# Patient Record
Sex: Female | Born: 1955 | Race: White | Hispanic: No | Marital: Married | Smoking: Never smoker
Health system: Southern US, Community
[De-identification: ages and names within clinical notes are randomized; demographics above are authoritative.]

## PROBLEM LIST (undated history)

## (undated) DIAGNOSIS — R51 Headache: Secondary | ICD-10-CM

## (undated) DIAGNOSIS — R519 Headache, unspecified: Secondary | ICD-10-CM

## (undated) DIAGNOSIS — I10 Essential (primary) hypertension: Secondary | ICD-10-CM

## (undated) DIAGNOSIS — D860 Sarcoidosis of lung: Secondary | ICD-10-CM

## (undated) DIAGNOSIS — E785 Hyperlipidemia, unspecified: Secondary | ICD-10-CM

## (undated) DIAGNOSIS — R05 Cough: Secondary | ICD-10-CM

## (undated) DIAGNOSIS — G8929 Other chronic pain: Secondary | ICD-10-CM

## (undated) DIAGNOSIS — R059 Cough, unspecified: Secondary | ICD-10-CM

## (undated) HISTORY — DX: Sarcoidosis of lung: D86.0

## (undated) HISTORY — DX: Headache, unspecified: R51.9

## (undated) HISTORY — DX: Hyperlipidemia, unspecified: E78.5

## (undated) HISTORY — DX: Other chronic pain: G89.29

## (undated) HISTORY — DX: Essential (primary) hypertension: I10

## (undated) HISTORY — DX: Headache: R51

## (undated) HISTORY — DX: Cough: R05

## (undated) HISTORY — DX: Cough, unspecified: R05.9

---

## 2008-04-24 ENCOUNTER — Encounter: Payer: Self-pay | Admitting: Pulmonary Disease

## 2008-04-25 ENCOUNTER — Encounter: Payer: Self-pay | Admitting: Pulmonary Disease

## 2008-08-17 ENCOUNTER — Encounter: Payer: Self-pay | Admitting: Pulmonary Disease

## 2008-09-13 ENCOUNTER — Encounter: Payer: Self-pay | Admitting: Pulmonary Disease

## 2008-09-29 ENCOUNTER — Ambulatory Visit: Payer: Self-pay | Admitting: Pulmonary Disease

## 2008-09-29 DIAGNOSIS — R51 Headache: Secondary | ICD-10-CM | POA: Insufficient documentation

## 2008-09-29 DIAGNOSIS — I10 Essential (primary) hypertension: Secondary | ICD-10-CM | POA: Insufficient documentation

## 2008-09-29 DIAGNOSIS — R519 Headache, unspecified: Secondary | ICD-10-CM | POA: Insufficient documentation

## 2008-09-29 DIAGNOSIS — J309 Allergic rhinitis, unspecified: Secondary | ICD-10-CM | POA: Insufficient documentation

## 2008-09-29 DIAGNOSIS — E785 Hyperlipidemia, unspecified: Secondary | ICD-10-CM | POA: Insufficient documentation

## 2008-10-05 ENCOUNTER — Ambulatory Visit: Payer: Self-pay | Admitting: Pulmonary Disease

## 2008-10-05 DIAGNOSIS — D869 Sarcoidosis, unspecified: Secondary | ICD-10-CM | POA: Insufficient documentation

## 2008-11-16 ENCOUNTER — Telehealth (INDEPENDENT_AMBULATORY_CARE_PROVIDER_SITE_OTHER): Payer: Self-pay | Admitting: *Deleted

## 2008-11-27 ENCOUNTER — Encounter: Payer: Self-pay | Admitting: Adult Health

## 2008-11-27 ENCOUNTER — Ambulatory Visit: Payer: Self-pay | Admitting: Internal Medicine

## 2008-12-18 ENCOUNTER — Ambulatory Visit: Payer: Self-pay | Admitting: Pulmonary Disease

## 2008-12-18 DIAGNOSIS — R059 Cough, unspecified: Secondary | ICD-10-CM | POA: Insufficient documentation

## 2008-12-18 DIAGNOSIS — R05 Cough: Secondary | ICD-10-CM | POA: Insufficient documentation

## 2009-01-04 ENCOUNTER — Telehealth: Payer: Self-pay | Admitting: Pulmonary Disease

## 2009-01-24 ENCOUNTER — Ambulatory Visit: Payer: Self-pay | Admitting: Pulmonary Disease

## 2009-07-23 ENCOUNTER — Ambulatory Visit: Payer: Self-pay | Admitting: Pulmonary Disease

## 2010-01-23 ENCOUNTER — Ambulatory Visit: Payer: Self-pay | Admitting: Pulmonary Disease

## 2010-06-06 ENCOUNTER — Ambulatory Visit
Admission: RE | Admit: 2010-06-06 | Discharge: 2010-06-06 | Payer: Self-pay | Source: Home / Self Care | Attending: Pulmonary Disease | Admitting: Pulmonary Disease

## 2010-06-18 NOTE — Miscellaneous (Signed)
Summary: Orders Update pft charges  Clinical Lists Changes  Orders: Added new Service order of Carbon Monoxide diffusing w/capacity (94720) - Signed Added new Service order of Lung Volumes (94240) - Signed Added new Service order of Spirometry (Pre & Post) (94060) - Signed 

## 2010-06-18 NOTE — Assessment & Plan Note (Signed)
Summary: rov for sarcoidosis   Copy to:  Blane Ohara Primary Provider/Referring Provider:  Blane Ohara  CC:  Pt is here for a 6 month f/u appt.  Pt states breathing has improved since last visit.  Pt states she continues to use Foradil and Qvar.  Pt states very rarely non-productive cough.  Pt denied any new complaints.  .  History of Present Illness: the pt comes in today for f/u sarcoidosis, primarily manifesting as cough.  She has been doing well on qvar/foradil combo, and reports no sob, chest congestion, or purulence.  Her cough has been fairly well controlled.  No night sweats or joint issues.  Current Medications (verified): 1)  Wellbutrin Xl 300 Mg Xr24h-Tab (Bupropion Hcl) .Marland Kitchen.. 1 By Mouth Daily 2)  Pristiq 50 Mg Xr24h-Tab (Desvenlafaxine Succinate) .Marland Kitchen.. 1 By Mouth Daily 3)  Simvastatin 80 Mg Tabs (Simvastatin) .Marland Kitchen.. 1 By Mouth At Bedtime 4)  Trilipix 135 Mg Cpdr (Choline Fenofibrate) .Marland Kitchen.. 1 By Mouth Daily 5)  Micardis 20 Mg Tabs (Telmisartan) .Marland Kitchen.. 1 By Mouth Daily 6)  Hydrochlorothiazide 25 Mg Tabs (Hydrochlorothiazide) .Marland Kitchen.. 1 By Mouth Daily 7)  Estrace 2 Mg Tabs (Estradiol) .Marland Kitchen.. 1 By Mouth Daily 8)  Lovaza 1 Gm Caps (Omega-3-Acid Ethyl Esters) .Marland Kitchen.. 1 By Mouth Two Times A Day 9)  Foradil Aerolizer 12 Mcg Caps (Formoterol Fumarate) .... Inhale One Capsule Twice A Day 10)  Qvar 80 Mcg/act  Aers (Beclomethasone Dipropionate) .... Two  Puffs Twice Daily 11)  Klor-Con 20 Meq Pack (Potassium Chloride) .... Take 1 Tablet By Mouth Once A Day  Allergies (verified): 1)  ! Percocet  Review of Systems      See HPI  Vital Signs:  Patient profile:   55 year old female Height:      66 inches Weight:      204.25 pounds BMI:     33.09 O2 Sat:      98 % on Room air Temp:     97.7 degrees F oral Pulse rate:   55 / minute BP sitting:   114 / 78  (left arm) Cuff size:   regular  Vitals Entered By: Arman Filter LPN (July 23, 8293 9:21 AM)  O2 Flow:  Room air CC: Pt is here for a 6  month f/u appt.  Pt states breathing has improved since last visit.  Pt states she continues to use Foradil and Qvar.  Pt states very rarely non-productive cough.  Pt denied any new complaints.   Comments Medications reviewed with patient Arman Filter LPN  July 24, 6211 9:21 AM    Physical Exam  General:  ow female in nad Lungs:  totally clear to auscultation Heart:  rrr Extremities:  no cyanosis or edema   Impression & Recommendations:  Problem # 1:  PULMONARY SARCOIDOSIS (ICD-135) Doing well on current meds.  There is a financial issue with 2 copays on her current regimen, and will therefore try her on dulera.  If this proves too irritating to her upper airway, may try on qvar alone.  Medications Added to Medication List This Visit: 1)  Klor-con 20 Meq Pack (Potassium chloride) .... Take 1 tablet by mouth once a day 2)  Dulera 100-5 Mcg/act Aero (Mometasone furo-formoterol fum) .... 2 puffs twice per day  Other Orders: Est. Patient Level II (08657) Pulmonary Referral (Pulmonary)  Patient Instructions: 1)  will try dulera 100/5 in the place of qvar/foradil to save expense.  Take 2 inhalations in am and pm...rinse  mouth well.  If increased cough, go back to qvar/foradil. 2)  followup with me in 6mos, and will check cxr and pfts at that time.   Prescriptions: DULERA 100-5 MCG/ACT AERO (MOMETASONE FURO-FORMOTEROL FUM) 2 puffs twice per day  #1 x 6   Entered and Authorized by:   Barbaraann Share MD   Signed by:   Barbaraann Share MD on 07/23/2009   Method used:   Print then Give to Patient   RxID:   [id number]    Immunization History:  Influenza Immunization History:    Influenza:  historical (01/17/2009)

## 2010-06-18 NOTE — Assessment & Plan Note (Signed)
Summary: rov for sarcoidosis   Primary Provider/Referring Provider:  Blane Ohara  CC:  followup on pfts and cxr.  no complaints.  Pt states she went back to Foradil and Qvar- states breathing was worse on Dulera- states it "burned her chest."  .  History of Present Illness: the pt comes in today for f/u her known sarcoidosis.  She has had no parenchymal issues, but has had a cough which seems to be controlled with LABA/ICS.  I tried her on dulera in order to limit copays, but she preferred to stay on foradil/qvar.  She goes thru episodes of cough, then may have no symptoms for months and can take a holiday from her inhalers.  She has had no issues with sob, and her pfts today are essentially normal.  Cxr today shows no change.  Current Medications (verified): 1)  Wellbutrin Xl 300 Mg Xr24h-Tab (Bupropion Hcl) .Marland Kitchen.. 1 By Mouth Daily 2)  Pristiq 50 Mg Xr24h-Tab (Desvenlafaxine Succinate) .Marland Kitchen.. 1 By Mouth Daily 3)  Simvastatin 80 Mg Tabs (Simvastatin) .Marland Kitchen.. 1 By Mouth At Bedtime 4)  Trilipix 135 Mg Cpdr (Choline Fenofibrate) .Marland Kitchen.. 1 By Mouth Daily 5)  Micardis 20 Mg Tabs (Telmisartan) .Marland Kitchen.. 1 By Mouth Daily 6)  Hydrochlorothiazide 25 Mg Tabs (Hydrochlorothiazide) .Marland Kitchen.. 1 By Mouth Daily 7)  Estrace 2 Mg Tabs (Estradiol) .Marland Kitchen.. 1 By Mouth Daily 8)  Lovaza 1 Gm Caps (Omega-3-Acid Ethyl Esters) .Marland Kitchen.. 1 By Mouth Two Times A Day 9)  Foradil Aerolizer 12 Mcg Caps (Formoterol Fumarate) .... Inhale One Capsule Twice A Day 10)  Qvar 80 Mcg/act  Aers (Beclomethasone Dipropionate) .... Two  Puffs Twice Daily 11)  Klor-Con 20 Meq Pack (Potassium Chloride) .... Take 1 Tablet By Mouth Once A Day  Allergies (verified): 1)  ! Percocet  Review of Systems       The patient complains of shortness of breath with activity, indigestion, nasal congestion/difficulty breathing through nose, anxiety, and depression.  The patient denies shortness of breath at rest, productive cough, non-productive cough, coughing up blood,  chest pain, irregular heartbeats, acid heartburn, loss of appetite, weight change, abdominal pain, difficulty swallowing, sore throat, tooth/dental problems, headaches, sneezing, itching, ear ache, hand/feet swelling, joint stiffness or pain, rash, change in color of mucus, and fever.    Vital Signs:  Patient profile:   55 year old female Height:      66 inches Weight:      202 pounds BMI:     32.72 O2 Sat:      97 % on Room air Temp:     98.1 degrees F oral Pulse rate:   86 / minute BP sitting:   100 / 78  (right arm) Cuff size:   regular  Vitals Entered By: Kandice Hams CMA (January 23, 2010 10:12 AM)  O2 Flow:  Room air CC: followup on pfts and cxr.  no complaints.  Pt states she went back to Foradil and Qvar- states breathing was worse on Dulera- states it "burned her chest."   Comments Medications reviewed with patient Kandice Hams CMA  January 23, 2010 10:15 AM    Physical Exam  General:  obese female in nad Lungs:  clear to auscultation Heart:  rrr, no mrg Extremities:  no edema or cyanosis  Neurologic:  alert and oriented, moves all 4.   Impression & Recommendations:  Problem # 1:  PULMONARY SARCOIDOSIS (ICD-135) the pt is stable from an xray, pfts, and clinical standpoint.  She will need yearly  f/u, or sooner if having issues  Problem # 2:  COUGH (ICD-786.2) suspect is due to airway involvement with sarcoid.  She responds nicely to LABA/ICS.  Other Orders: Est. Patient Level III (81191) T-2 View CXR (71020TC)  Patient Instructions: 1)  ok to use inhalers just when the cough flares up or becomes an issue for you. 2)  work on weight loss and exercise program 3)  followup with me in one year, and will check a cxr the same day.  If your breathing status changes leading up to your followup visit, let me know and will schedule for same day breathing studies as well.

## 2010-06-26 NOTE — Assessment & Plan Note (Signed)
Summary: acute sick visit for cough   Copy to:  Blane Ohara Primary Provider/Referring Provider:  Blane Ohara  CC:  sick visit.  pt c/o non-productive cough, "no energy, "  wheezing, and and tightness in chest.  Symptoms started 2 weeks ago.  Denies fever.  Marland Kitchen  History of Present Illness: the pt comes in today for an acute sick visit related to worsening cough.  She has known sarcoidosis, but has no airflow obstruction on pfts.  However, she has had a chronic cough that responded very well to ICS/LABA, and therefore the cough was felt due to sarcoid.  She was doing well, and discontinued her meds, but the cough has now returned.  She denies any mucus or purulence, but does describe upper airway wheezing.   Current Medications (verified): 1)  Wellbutrin Xl 300 Mg Xr24h-Tab (Bupropion Hcl) .Marland Kitchen.. 1 By Mouth Daily 2)  Pristiq 50 Mg Xr24h-Tab (Desvenlafaxine Succinate) .Marland Kitchen.. 1 By Mouth Daily 3)  Simvastatin 80 Mg Tabs (Simvastatin) .Marland Kitchen.. 1 By Mouth At Bedtime 4)  Trilipix 135 Mg Cpdr (Choline Fenofibrate) .Marland Kitchen.. 1 By Mouth Daily 5)  Micardis 20 Mg Tabs (Telmisartan) .Marland Kitchen.. 1 By Mouth Daily 6)  Hydrochlorothiazide 25 Mg Tabs (Hydrochlorothiazide) .Marland Kitchen.. 1 By Mouth Daily 7)  Estrace 2 Mg Tabs (Estradiol) .Marland Kitchen.. 1 By Mouth Daily 8)  Lovaza 1 Gm Caps (Omega-3-Acid Ethyl Esters) .Marland Kitchen.. 1 By Mouth Two Times A Day 9)  Klor-Con 20 Meq Pack (Potassium Chloride) .... Take 1 Tablet By Mouth Once A Day 10)  Proair Hfa 108 (90 Base) Mcg/act  Aers (Albuterol Sulfate) .Marland Kitchen.. 1-2 Puffs Every 4-6 Hours As Needed  Allergies (verified): 1)  ! Percocet  Past History:  Past medical, surgical, family and social histories (including risk factors) reviewed, and no changes noted (except as noted below).  Past Medical History:  HEADACHE, CHRONIC (ICD-784.0) HYPERTENSION (ICD-401.9) HYPERLIPIDEMIA (ICD-272.4) ALLERGIC RHINITIS (ICD-477.9) Sarcoidosis OSA    Past Surgical History: Reviewed history from 10/05/2008 and no  changes required. s/p hysterectomy s/p tubal ligation H/o rotator cuff repair  Family History: Reviewed history from 09/29/2008 and no changes required. heart disease: father clotting disorders: mother rheumatism: mother cancer: mother (pancreatic)  Social History: Reviewed history from 09/29/2008 and no changes required. Patient never smoked.  pt is divorced. pt has children. pt works as a Production manager.  Review of Systems       The patient complains of non-productive cough, acid heartburn, nasal congestion/difficulty breathing through nose, anxiety, depression, hand/feet swelling, and joint stiffness or pain.  The patient denies shortness of breath with activity, shortness of breath at rest, productive cough, coughing up blood, chest pain, irregular heartbeats, indigestion, loss of appetite, weight change, abdominal pain, difficulty swallowing, sore throat, tooth/dental problems, headaches, sneezing, itching, ear ache, rash, change in color of mucus, and fever.    Vital Signs:  Patient profile:   55 year old female Height:      66 inches Weight:      203.50 pounds BMI:     32.96 O2 Sat:      96 % on Room air Temp:     97.5 degrees F oral Pulse rate:   71 / minute BP sitting:   124 / 80  (left arm) Cuff size:   regular  Vitals Entered By: Arman Filter LPN (June 06, 2010 9:14 AM)  O2 Flow:  Room air CC: sick visit.  pt c/o non-productive cough, "no energy,"  wheezing, and tightness in chest.  Symptoms started 2 weeks ago.  Denies fever.   Comments Medications reviewed with patient Arman Filter LPN  June 06, 2010 9:14 AM    Physical Exam  General:  ow female in nad Nose:  no purulence or discharge noted. Lungs:  clear to auscultation, no wheezing or crackles. Heart:  rrr, no mrg Extremities:  no edema or cyanosis Neurologic:  alert and oriented, moves all 4.   Impression & Recommendations:  Problem # 1:  COUGH (ICD-786.2) the pt has a h/o cough that has  responded well in the past to ICS/LABA, and felt due to her sarcoid.  The cough has returned since discontinuing her inhalers, and therefore would like to restart to see if she has resolution of her symptoms.  If she does not, may need a course of prednisone.  Must keep in mind other etiologies such as reflux and postnasal drip  Medications Added to Medication List This Visit: 1)  Proair Hfa 108 (90 Base) Mcg/act Aers (Albuterol sulfate) .Marland Kitchen.. 1-2 puffs every 4-6 hours as needed 2)  Symbicort 160-4.5 Mcg/act Aero (Budesonide-formoterol fumarate) .... Two puffs twice daily  Other Orders: Est. Patient Level IV (04540)  Patient Instructions: 1)  get back on your inhalers to see if things improve.  Will try on symbicort 160/4.5 2puffs am and pm to try and consolidate your copays.  Rinse mouth well.   2)  If not improving with symbicort, please call and we can consider a short prednisone taper. 3)  keep established followup with me.    Prescriptions: SYMBICORT 160-4.5 MCG/ACT  AERO (BUDESONIDE-FORMOTEROL FUMARATE) Two puffs twice daily  #1 x 6   Entered and Authorized by:   Barbaraann Share MD   Signed by:   Barbaraann Share MD on 06/06/2010   Method used:   Print then Give to Patient   RxID:   [id number]    Immunization History:  Pneumovax Immunization History:    Pneumovax:  historical (03/19/2010)

## 2011-01-07 ENCOUNTER — Telehealth: Payer: Self-pay | Admitting: Pulmonary Disease

## 2011-01-07 MED ORDER — BUDESONIDE-FORMOTEROL FUMARATE 160-4.5 MCG/ACT IN AERO: 2.0000 | INHALATION_SPRAY | Freq: Two times a day (BID) | RESPIRATORY_TRACT | Status: DC

## 2011-01-07 NOTE — Telephone Encounter (Signed)
Called and spoke with pt. She states needs rx refill for symbicort. I advised okay x 1 but needs appt sched with KC. Pt verbalized understanding and sched appt with KC for 01/28/11 at 10:45 am. She will keep appt for future refills. Rx was sent to [facility] in [place] per her request.

## 2011-01-28 ENCOUNTER — Ambulatory Visit (INDEPENDENT_AMBULATORY_CARE_PROVIDER_SITE_OTHER)
Admission: RE | Admit: 2011-01-28 | Discharge: 2011-01-28 | Disposition: A | Discharge status: Home / Self Care | Payer: 59 | Source: Ambulatory Visit | Attending: Pulmonary Disease | Admitting: Pulmonary Disease

## 2011-01-28 ENCOUNTER — Ambulatory Visit (INDEPENDENT_AMBULATORY_CARE_PROVIDER_SITE_OTHER): Payer: 59 | Admitting: Pulmonary Disease

## 2011-01-28 ENCOUNTER — Encounter: Payer: Self-pay | Admitting: Pulmonary Disease

## 2011-01-28 VITALS — BP 138/80 | HR 64 | Temp 97.6°F | Ht 66.0 in | Wt 207.8 lb

## 2011-01-28 DIAGNOSIS — D869 Sarcoidosis, unspecified: Secondary | ICD-10-CM

## 2011-01-28 NOTE — Assessment & Plan Note (Addendum)
The patient is having chest symptoms that may or may not be related to her sarcoidosis.  She is due for her yearly chest x-ray and pulmonary function studies to evaluate objectively, but I wonder based on her symptoms if reflux disease may be an issue for her.  I would like to try her on a proton pump inhibitor for a few weeks while waiting on her test results.  I would have a high threshold for starting steroids without objective evidence for a sarcoid flare.  I also wonder how much her depression is contributing to her symptoms.

## 2011-01-28 NOTE — Patient Instructions (Signed)
Will check a cxr today and schedule for breathing tests over the next 2 weeks.  Will call you with results Will try you on dexilant one each am for next few weeks to see if chest symptoms improve. Please schedule a followup visit for one year, but we will be in touch about your testing and response to treatment.

## 2011-01-28 NOTE — Progress Notes (Signed)
  Subjective:    Patient ID: Cathy Walton, female    DOB: 31-Jul-1955, 55 y.o.   MRN: [id number]  HPI The patient comes in today for followup of her known pulmonary sarcoidosis.  She has had normal PFTs and chest x-ray last year, and has been treated with symbicort for a chronic cough that responded very well to the inhaler.  She comes in today where she is noting chest heaviness that is persistent and not related to exertion, a dry cough that sounds upper airway in origin, and finally audible wheezing but clearly has upper airway in origin.  The patient is denying any reflux symptoms.  She also has a history of depression, and is teary-eyed today during our visit.  She states that she is "tired of feeling poorly".  She denies having dyspnea on exertion.   Review of Systems  Constitutional: Negative for fever and unexpected weight change.  HENT: Positive for congestion and sinus pressure. Negative for ear pain, nosebleeds, sore throat, rhinorrhea, sneezing, trouble swallowing, dental problem and postnasal drip.   Eyes: Positive for itching. Negative for redness.  Respiratory: Positive for cough, chest tightness and wheezing. Negative for shortness of breath.   Cardiovascular: Negative for palpitations and leg swelling.  Gastrointestinal: Negative for nausea and vomiting.  Genitourinary: Negative for dysuria.  Musculoskeletal: Negative for joint swelling.  Skin: Negative for rash.  Neurological: Positive for headaches.  Hematological: Bruises/bleeds easily.  Psychiatric/Behavioral: Negative for dysphoric mood. The patient is not nervous/anxious.        Objective:   Physical Exam Obese female in no acute distress Nose without purulence or discharge noted Chest totally clear to auscultation, no wheezes or rhonchi Cardiac exam with regular rate and rhythm Lower extremities without edema, no cyanosis noted Alert and oriented, moves all 4 extremities.       Assessment & Plan:

## 2011-01-30 ENCOUNTER — Telehealth: Payer: Self-pay | Admitting: Pulmonary Disease

## 2011-01-30 NOTE — Progress Notes (Signed)
Quick Note:  Spoke with pt and notified of results per Dr. Clance. Pt verbalized understanding and denied any questions.  ______ 

## 2011-01-30 NOTE — Telephone Encounter (Signed)
Spoke with pt and notified of results per Dr.Clance. Pt verbalized understanding and denied any questions.  

## 2011-02-04 ENCOUNTER — Ambulatory Visit (INDEPENDENT_AMBULATORY_CARE_PROVIDER_SITE_OTHER): Payer: 59 | Admitting: Pulmonary Disease

## 2011-02-04 DIAGNOSIS — D869 Sarcoidosis, unspecified: Secondary | ICD-10-CM

## 2011-02-04 LAB — PULMONARY FUNCTION TEST

## 2011-02-04 NOTE — Progress Notes (Signed)
PFT done today. 

## 2011-02-06 ENCOUNTER — Telehealth: Payer: Self-pay | Admitting: Pulmonary Disease

## 2011-02-06 NOTE — Telephone Encounter (Signed)
Megan, please let pt know that her pfts are fairly stable from last year.  I do not see a change in cxr or breathing tests from last year that is significant.  Let's stick with plan from last office visit, and give her time on the medication for acid reflux.  Have let me know how things are going after another week of therapy.

## 2011-02-07 NOTE — Telephone Encounter (Signed)
[id number] 

## 2011-02-12 NOTE — Telephone Encounter (Signed)
[id number] 

## 2011-02-13 ENCOUNTER — Encounter: Payer: Self-pay | Admitting: *Deleted

## 2011-02-13 NOTE — Telephone Encounter (Signed)
[id number].  This is my 3rd attempt to contact pt.  Per protocol, will sign phone note and send pt a letter to call our office to discuss results.

## 2012-01-28 ENCOUNTER — Ambulatory Visit (INDEPENDENT_AMBULATORY_CARE_PROVIDER_SITE_OTHER): Payer: 59 | Admitting: Pulmonary Disease

## 2012-01-28 ENCOUNTER — Encounter: Payer: Self-pay | Admitting: Pulmonary Disease

## 2012-01-28 ENCOUNTER — Ambulatory Visit (INDEPENDENT_AMBULATORY_CARE_PROVIDER_SITE_OTHER)
Admission: RE | Admit: 2012-01-28 | Discharge: 2012-01-28 | Disposition: A | Discharge status: Home / Self Care | Payer: 59 | Source: Ambulatory Visit | Attending: Pulmonary Disease | Admitting: Pulmonary Disease

## 2012-01-28 VITALS — BP 112/86 | HR 72 | Temp 97.9°F | Ht 66.0 in | Wt 186.0 lb

## 2012-01-28 DIAGNOSIS — Z23 Encounter for immunization: Secondary | ICD-10-CM

## 2012-01-28 DIAGNOSIS — D869 Sarcoidosis, unspecified: Secondary | ICD-10-CM

## 2012-01-28 NOTE — Progress Notes (Signed)
  Subjective:    Patient ID: Cathy Walton, female    DOB: 05/31/1955, 56 y.o.   MRN: [id number]  HPI The patient comes in today for followup of her known sarcoidosis, primarily manifested as cough.  She had been doing very well on symbicort, however recently had worsening of her cough after discontinuing.  She has subsequently gotten back on the inhaler, with resolution of her symptoms. She denies any worsening shortness of breath, joint complaints, or significant skin changes.   Review of Systems  Constitutional: Negative for fever and unexpected weight change.  HENT: Positive for congestion and rhinorrhea. Negative for ear pain, nosebleeds, sore throat, sneezing, trouble swallowing, dental problem, postnasal drip and sinus pressure.   Eyes: Negative for redness and itching.  Respiratory: Negative for cough, chest tightness, shortness of breath and wheezing.   Cardiovascular: Negative for palpitations and leg swelling.  Gastrointestinal: Negative for nausea and vomiting.  Genitourinary: Negative for dysuria.  Musculoskeletal: Negative for joint swelling.  Skin: Negative for rash.  Neurological: Positive for headaches.  Hematological: Bruises/bleeds easily.  Psychiatric/Behavioral: Positive for dysphoric mood. The patient is nervous/anxious.        Objective:   Physical Exam Obese female in nad Nose without purulence or discharge noted. Chest totally clear to auscultation Cor with rrr LE without edema, no cyanosis  Alert and oriented, moves all 4.        Assessment & Plan:

## 2012-01-28 NOTE — Assessment & Plan Note (Signed)
The pt is doing well from a sarcoid standpoint, but did have escalation of her cough off symbicort.  This has been restarted, and now is doing well. Will not check PFT's this visit since she is doing so well, but will check one more cxr.  I have also asked her to work on weight loss and conditioning.

## 2012-01-28 NOTE — Patient Instructions (Addendum)
Ok to decrease symbicort to once a day to see if it will keep cough controlled. Will check cxr today, and call you with results. followup with me in one year.

## 2012-02-13 NOTE — Progress Notes (Signed)
Quick Note:  Called, spoke with pt. Informed her of cxr results per Dr. Shelle Iron. She verbalized understanding. ______

## 2013-01-27 ENCOUNTER — Ambulatory Visit (INDEPENDENT_AMBULATORY_CARE_PROVIDER_SITE_OTHER): Payer: 59 | Admitting: Pulmonary Disease

## 2013-01-27 ENCOUNTER — Encounter: Payer: Self-pay | Admitting: Pulmonary Disease

## 2013-01-27 VITALS — BP 118/72 | HR 56 | Temp 97.8°F | Ht 66.0 in | Wt 201.6 lb

## 2013-01-27 DIAGNOSIS — D869 Sarcoidosis, unspecified: Secondary | ICD-10-CM

## 2013-01-27 NOTE — Progress Notes (Signed)
  Subjective:    Patient ID: Cathy Walton, female    DOB: 1956-01-18, 57 y.o.   MRN: [id number]  HPI The patient comes in today for followup of her known sarcoidosis.  She has not had any significant pulmonary manifestation except occasional cough, and has been using symbicort for a few weeks at a time if her cough flares up rather than on an everyday basis.  She has been doing well with this, and feels that her cough is well controlled.  She has no breathing issues, nor has she seen any skin changes.  She is seeing an ophthalmologist yearly for her eyes.   Review of Systems  Constitutional: Negative for fever and unexpected weight change.  HENT: Positive for congestion and sinus pressure. Negative for ear pain, nosebleeds, sore throat, rhinorrhea, sneezing, trouble swallowing, dental problem and postnasal drip.   Eyes: Negative for redness and itching.  Respiratory: Negative for cough, chest tightness, shortness of breath and wheezing.   Cardiovascular: Negative for palpitations and leg swelling.  Gastrointestinal: Negative for nausea and vomiting.  Genitourinary: Negative for dysuria.  Musculoskeletal: Negative for joint swelling.  Skin: Negative for rash.  Neurological: Positive for headaches.  Hematological: Does not bruise/bleed easily.  Psychiatric/Behavioral: Negative for dysphoric mood. The patient is not nervous/anxious.        Objective:   Physical Exam Overweight female in no acute distress Nose without purulence or discharge noted Neck without lymphadenopathy or thyromegaly Chest totally clear to auscultation, no wheezing Cardiac exam with regular rate and rhythm Lower extremities without edema, no cyanosis Alert and oriented, moves all 4 extremities.       Assessment & Plan:

## 2013-01-27 NOTE — Assessment & Plan Note (Signed)
The patient is doing very well from a sarcoid standpoint.  Her PFTs in the past have been completely stable, and her chest x-ray last year showed no acute process.  Her cough is well controlled with the symbicort, and in fact she is only using this when she feels her cough beginning to flareup.  Because of this, I would like to change her inhaler over to Qvar alone to see if this will do just as well.  I've also encouraged her to work aggressively on weight loss and some type of exercise program.  Like to see her back in one year.

## 2013-01-27 NOTE — Patient Instructions (Addendum)
Would like to see if we can get you by on less medication.  Would try qvar 80, 2 inhalations am and pm.  Rinse mouth well.  Let me know if this does not work for you.  (use up your supply of symbicort first). followup with me in one year, but call if having issues.  Will check followup chest xray next visit.

## 2014-01-27 ENCOUNTER — Encounter: Payer: Self-pay | Admitting: Pulmonary Disease

## 2014-01-27 ENCOUNTER — Encounter (INDEPENDENT_AMBULATORY_CARE_PROVIDER_SITE_OTHER): Payer: Self-pay

## 2014-01-27 ENCOUNTER — Ambulatory Visit (INDEPENDENT_AMBULATORY_CARE_PROVIDER_SITE_OTHER): Payer: 59 | Admitting: Pulmonary Disease

## 2014-01-27 ENCOUNTER — Ambulatory Visit (INDEPENDENT_AMBULATORY_CARE_PROVIDER_SITE_OTHER)
Admission: RE | Admit: 2014-01-27 | Discharge: 2014-01-27 | Disposition: A | Discharge status: Home / Self Care | Payer: 59 | Source: Ambulatory Visit | Attending: Pulmonary Disease | Admitting: Pulmonary Disease

## 2014-01-27 VITALS — BP 122/74 | HR 56 | Temp 97.7°F | Ht 66.0 in | Wt 205.8 lb

## 2014-01-27 DIAGNOSIS — D869 Sarcoidosis, unspecified: Secondary | ICD-10-CM

## 2014-01-27 NOTE — Patient Instructions (Signed)
Continue to stay active, and you have qvar available if your cough returns. followup with me one more time in a year, and will check final chest xray.

## 2014-01-27 NOTE — Progress Notes (Signed)
   Subjective:    Patient ID: Cathy Walton, female    DOB: [birth year], 58 y.o.   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 It has primarily manifested as a cough, and at the last visit I have given her Qvar to use instead of Symbicort if cough persisted. She comes in today where she tells me she did not require the Qvar, and her cough has totally resolved. She feels her breathing is at baseline, and has no joint or eye complaints. She has had a followup chest x-ray today which is unremarkable.   Review of Systems  Constitutional: Negative for fever and unexpected weight change.  HENT: Negative for congestion, dental problem, ear pain, nosebleeds, postnasal drip, rhinorrhea, sinus pressure, sneezing, sore throat and trouble swallowing.   Eyes: Negative for redness and itching.  Respiratory: Negative for cough, chest tightness, shortness of breath and wheezing.   Cardiovascular: Negative for palpitations and leg swelling.  Gastrointestinal: Negative for nausea and vomiting.  Genitourinary: Negative for dysuria.  Musculoskeletal: Negative for joint swelling.  Skin: Negative for rash.  Neurological: Negative for headaches.  Hematological: Does not bruise/bleed easily.  Psychiatric/Behavioral: Negative for dysphoric mood. The patient is not nervous/anxious.        Objective:   Physical Exam Ow female in nad Nose without purulence or d/c noted Neck without LN or TMG Chest totally clear Cor with rrr LE without edema, no cyanosis Alert and oriented, moves all 4       Assessment & Plan:

## 2014-01-27 NOTE — Assessment & Plan Note (Signed)
The patient is doing very well currently, and is basically asymptomatic. She has not required Qvar for cough, and feels that her breathing is doing well. Her chest x-ray today appears clear. I have asked her to followup one more time with me in a year, and we'll check a final chest x-ray.

## 2015-01-29 ENCOUNTER — Encounter: Payer: Self-pay | Admitting: Emergency Medicine

## 2015-01-29 ENCOUNTER — Ambulatory Visit (INDEPENDENT_AMBULATORY_CARE_PROVIDER_SITE_OTHER): Payer: 59 | Admitting: Emergency Medicine

## 2015-01-29 ENCOUNTER — Ambulatory Visit: Payer: 59 | Admitting: Pulmonary Disease

## 2015-01-29 ENCOUNTER — Ambulatory Visit (INDEPENDENT_AMBULATORY_CARE_PROVIDER_SITE_OTHER)
Admission: RE | Admit: 2015-01-29 | Discharge: 2015-01-29 | Disposition: A | Discharge status: Home / Self Care | Payer: 59 | Source: Ambulatory Visit | Attending: Emergency Medicine | Admitting: Emergency Medicine

## 2015-01-29 VITALS — BP 118/82 | HR 57 | Ht 66.0 in | Wt 208.0 lb

## 2015-01-29 DIAGNOSIS — D869 Sarcoidosis, unspecified: Secondary | ICD-10-CM

## 2015-01-29 NOTE — Patient Instructions (Signed)
We will perform full pulmonary function testing to compare with your prior Chest x-ray today to compare with prior Remember to get the flu shot this fall Follow with your ophthalmologist annually as planned Follow with Dr Delton Coombes in 12 months or sooner if you have any problems

## 2015-01-29 NOTE — Assessment & Plan Note (Signed)
Follow-up visit for pulmonary sarcoidosis that appears to have fully responded to corticosteroids and is now quiescent. She is not having difficulty with cough or dyspnea. Currently off bronchodilators. She has not required systemic steroids for at least 4 years. At this point I believe we need to follow her pulmonary function testing and chest x-ray. She does not need bronchodilators for now. I underscored the importance of her annual ophthalmology exam. We will follow in 1 year or prn.

## 2015-01-29 NOTE — Progress Notes (Signed)
Subjective:    Patient ID: Cathy Walton, female    DOB: 07-25-1955, 59 y.o.   MRN: [id number]  HPI 59 year old never smoker with a history of sarcoidosis, hypertension, allergic rhinitis and chronic cough. Her most recent pulmonary function testing was in 2012 and was reviewed by me today. It showed mild obstruction without a bronchodilator response, normal lung volumes and a slightly decreased diffusion capacity that corrects to normal range for alveolar volume. 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  Has been treated for ocular and pulmonary sarcoid with prednisone in the past.  She was able to stop symbicort 2 yrs, hasn't needed albuterol. No functional limitations.    Review of Systems Per HPI  Past Medical History  Diagnosis Date  . Cough   . Pulmonary sarcoidosis   . Chronic headache   . Hypertension   . Hyperlipidemia   . Allergic rhinitis      No family history on file.   Social History   Social History  . Marital Status: Married    Spouse Name: N/A  . Number of Children: N/A  . Years of Education: N/A   Occupational History  . Not on file.   Social History Main Topics  . Smoking status: Never Smoker   . Smokeless tobacco: Not on file  . Alcohol Use: Not on file  . Drug Use: Not on file  . Sexual Activity: Not on file   Other Topics Concern  . Not on file   Social History Narrative     Allergies  Allergen Reactions  . Oxycodone-Acetaminophen     REACTION: itch     Outpatient Prescriptions Prior to Visit  Medication Sig Dispense Refill  . albuterol (PROVENTIL HFA;VENTOLIN HFA) 108 (90 BASE) MCG/ACT inhaler Inhale 2 puffs into the lungs every 6 (six) hours as needed.      Marland Kitchen buPROPion (WELLBUTRIN SR) 150 MG 12 hr tablet Take 1 tablet by mouth Twice daily.    . CRESTOR 20 MG tablet Take 40 mg by mouth daily.     Marland Kitchen estradiol (ESTRACE) 2 MG  tablet Take 1 tablet by mouth daily.    . hydrochlorothiazide 25 MG tablet Take 1 tablet by mouth daily.    Marland Kitchen KLOR-CON M20 20 MEQ tablet Take 1 tablet by mouth daily.    Marland Kitchen PRISTIQ 100 MG 24 hr tablet Take 1 tablet by mouth daily.    . valsartan (DIOVAN) 40 MG tablet Take 40 mg by mouth daily.    . budesonide-formoterol (SYMBICORT) 160-4.5 MCG/ACT inhaler Inhale 2 puffs into the lungs 2 (two) times daily. 1 Inhaler 0   No facility-administered medications prior to visit.        Objective:   Physical Exam Filed Vitals:   01/29/15 1456  BP: 118/82  Pulse: 57  Height:  (1.676 m)  Weight: 208 lb (94.348 kg)  SpO2: 97%   Gen: Pleasant, overwt woman, in no distress,  normal affect  ENT: No lesions,  mouth clear,  oropharynx clear, no postnasal drip  Neck: No JVD, no TMG, no carotid bruits  Lungs: No use of accessory muscles, clear without rales or rhonchi  Cardiovascular: RRR, heart sounds normal, no murmur or gallops, no peripheral edema  Musculoskeletal: No deformities, no cyanosis or clubbing  Neuro: alert, non focal  Skin: Warm, no lesions or rashes      Assessment & Plan:  PULMONARY SARCOIDOSIS Follow-up visit for pulmonary sarcoidosis that appears to have fully responded to corticosteroids and is now quiescent. She is not having difficulty with cough or dyspnea. Currently off bronchodilators. She has not required systemic steroids for at least 4 years. At this point I believe we need to follow her pulmonary function testing and chest x-ray. She does not need bronchodilators for now. I underscored the importance of her annual ophthalmology exam. We will follow in 1 year or prn.

## 2015-02-21 ENCOUNTER — Ambulatory Visit (INDEPENDENT_AMBULATORY_CARE_PROVIDER_SITE_OTHER): Payer: 59 | Admitting: Emergency Medicine

## 2015-02-21 DIAGNOSIS — D869 Sarcoidosis, unspecified: Secondary | ICD-10-CM | POA: Diagnosis not present

## 2015-02-21 LAB — PULMONARY FUNCTION TEST
DL/VA % pred: 87 %
DL/VA: 4.42 ml/min/mmHg/L
DLCO UNC % PRED: 77 %
DLCO unc: 20.92 ml/min/mmHg
FEF 25-75 PRE: 2.4 L/s
FEF 25-75 Post: 2.39 L/sec
FEF2575-%CHANGE-POST: 0 %
FEF2575-%PRED-POST: 98 %
FEF2575-%Pred-Pre: 99 %
FEV1-%CHANGE-POST: 0 %
FEV1-%Pred-Post: 90 %
FEV1-%Pred-Pre: 90 %
FEV1-POST: 2.47 L
FEV1-PRE: 2.47 L
FEV1FVC-%CHANGE-POST: 0 %
FEV1FVC-%PRED-PRE: 104 %
FEV6-%Change-Post: 0 %
FEV6-%PRED-POST: 89 %
FEV6-%Pred-Pre: 89 %
FEV6-Post: 3.04 L
FEV6-Pre: 3.05 L
FEV6FVC-%Pred-Post: 104 %
FEV6FVC-%Pred-Pre: 104 %
FVC-%CHANGE-POST: 0 %
FVC-%PRED-POST: 86 %
FVC-%PRED-PRE: 86 %
FVC-PRE: 3.05 L
FVC-Post: 3.04 L
POST FEV1/FVC RATIO: 81 %
PRE FEV6/FVC RATIO: 100 %
Post FEV6/FVC ratio: 100 %
Pre FEV1/FVC ratio: 81 %
RV % PRED: 91 %
RV: 1.93 L
TLC % pred: 94 %
TLC: 5.04 L

## 2015-02-21 NOTE — Progress Notes (Signed)
PFT done today. 

## 2015-09-03 DIAGNOSIS — Z1211 Encounter for screening for malignant neoplasm of colon: Secondary | ICD-10-CM | POA: Diagnosis not present

## 2015-09-03 DIAGNOSIS — Z0000 Encounter for general adult medical examination without abnormal findings: Secondary | ICD-10-CM | POA: Diagnosis not present

## 2015-09-03 DIAGNOSIS — R310 Gross hematuria: Secondary | ICD-10-CM | POA: Diagnosis not present

## 2015-09-03 DIAGNOSIS — Z01419 Encounter for gynecological examination (general) (routine) without abnormal findings: Secondary | ICD-10-CM | POA: Diagnosis not present

## 2015-09-03 DIAGNOSIS — Z1239 Encounter for other screening for malignant neoplasm of breast: Secondary | ICD-10-CM | POA: Diagnosis not present

## 2015-09-03 DIAGNOSIS — Z0001 Encounter for general adult medical examination with abnormal findings: Secondary | ICD-10-CM | POA: Diagnosis not present

## 2015-09-03 DIAGNOSIS — N3001 Acute cystitis with hematuria: Secondary | ICD-10-CM | POA: Diagnosis not present

## 2015-09-03 DIAGNOSIS — Z124 Encounter for screening for malignant neoplasm of cervix: Secondary | ICD-10-CM | POA: Diagnosis not present

## 2015-12-04 DIAGNOSIS — F320 Major depressive disorder, single episode, mild: Secondary | ICD-10-CM | POA: Diagnosis not present

## 2015-12-04 DIAGNOSIS — I10 Essential (primary) hypertension: Secondary | ICD-10-CM | POA: Diagnosis not present

## 2015-12-04 DIAGNOSIS — E782 Mixed hyperlipidemia: Secondary | ICD-10-CM | POA: Diagnosis not present

## 2015-12-04 DIAGNOSIS — R7301 Impaired fasting glucose: Secondary | ICD-10-CM | POA: Diagnosis not present

## 2015-12-07 DIAGNOSIS — Z1231 Encounter for screening mammogram for malignant neoplasm of breast: Secondary | ICD-10-CM | POA: Diagnosis not present

## 2016-01-14 DIAGNOSIS — L820 Inflamed seborrheic keratosis: Secondary | ICD-10-CM | POA: Diagnosis not present

## 2016-03-17 DIAGNOSIS — R7301 Impaired fasting glucose: Secondary | ICD-10-CM | POA: Diagnosis not present

## 2016-03-17 DIAGNOSIS — I10 Essential (primary) hypertension: Secondary | ICD-10-CM | POA: Diagnosis not present

## 2016-03-17 DIAGNOSIS — E782 Mixed hyperlipidemia: Secondary | ICD-10-CM | POA: Diagnosis not present

## 2016-03-17 DIAGNOSIS — F320 Major depressive disorder, single episode, mild: Secondary | ICD-10-CM | POA: Diagnosis not present

## 2016-04-28 IMAGING — CR DG CHEST 2V
2 series · 2 of 2 positions shown · non-contrast
Comparison: Chest x-ray dated 01/27/2014.

CLINICAL DATA: Sarcoidosis.  Hypertension.

EXAM:
CHEST  2 VIEW

[view not recorded (1 of 2)]
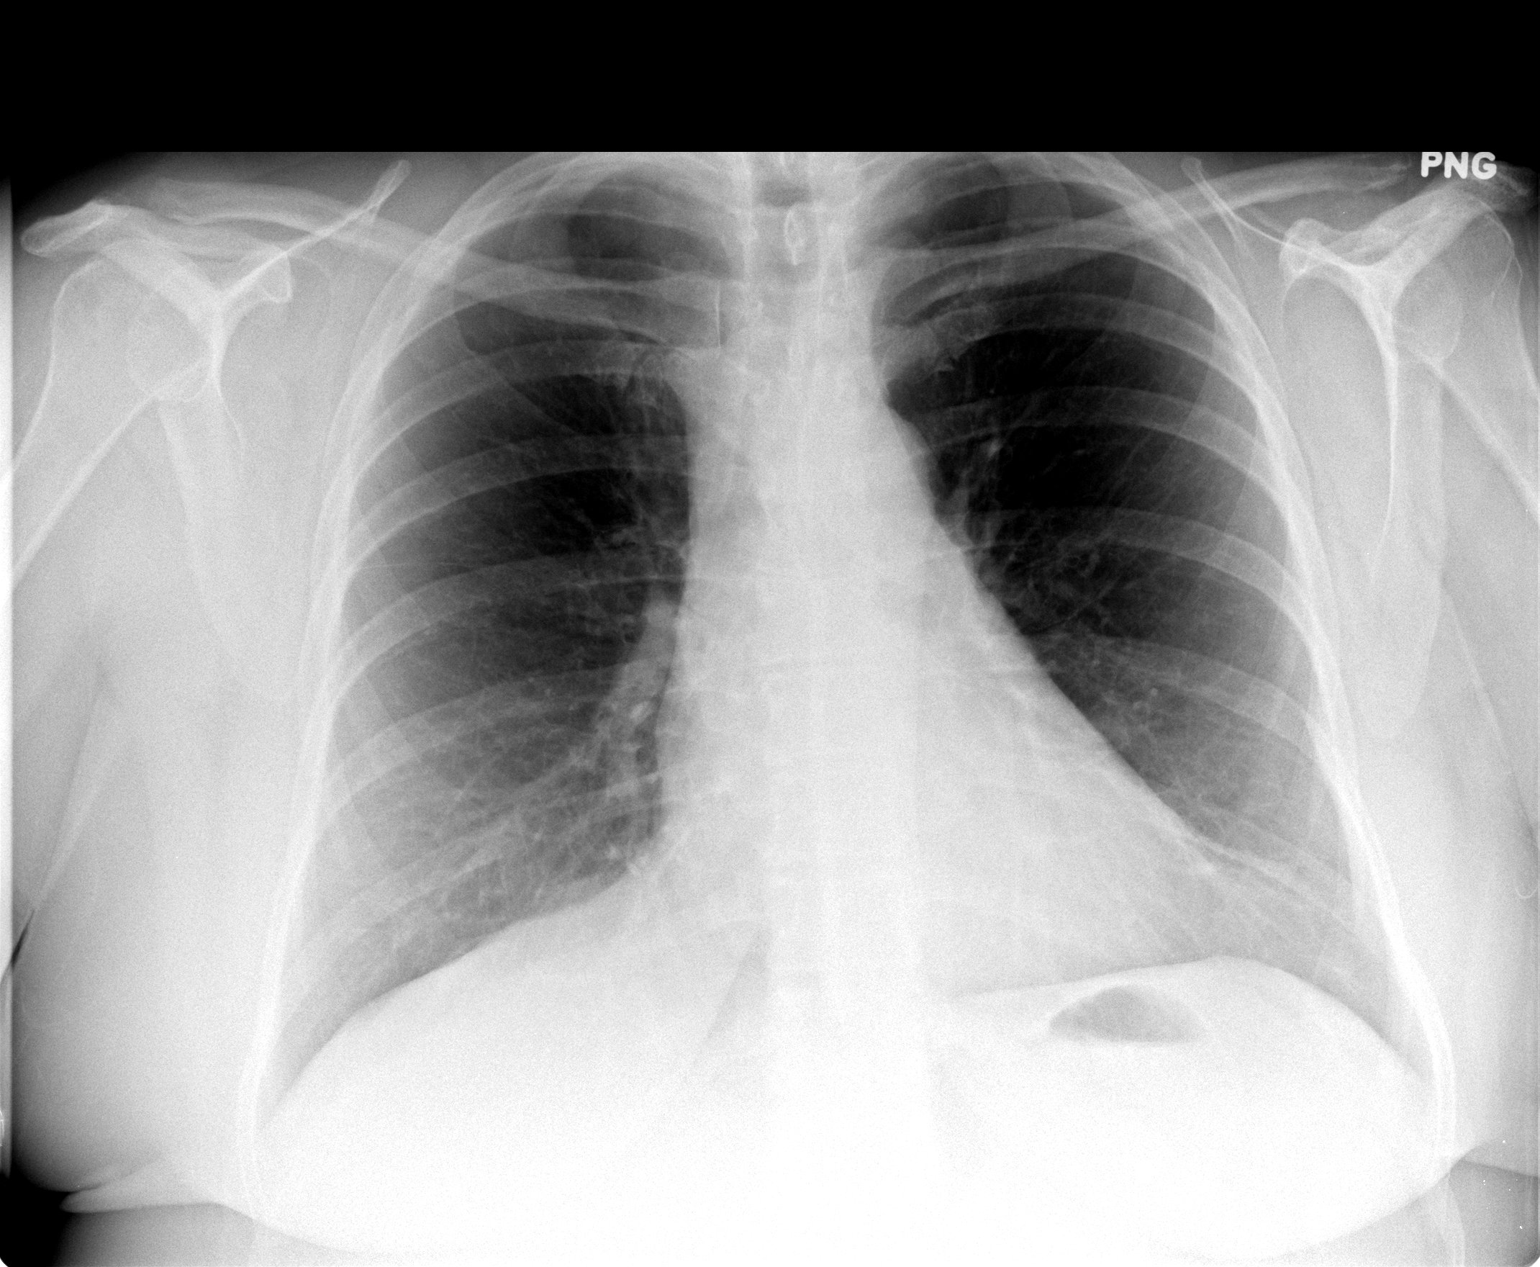

[view not recorded (2 of 2)]
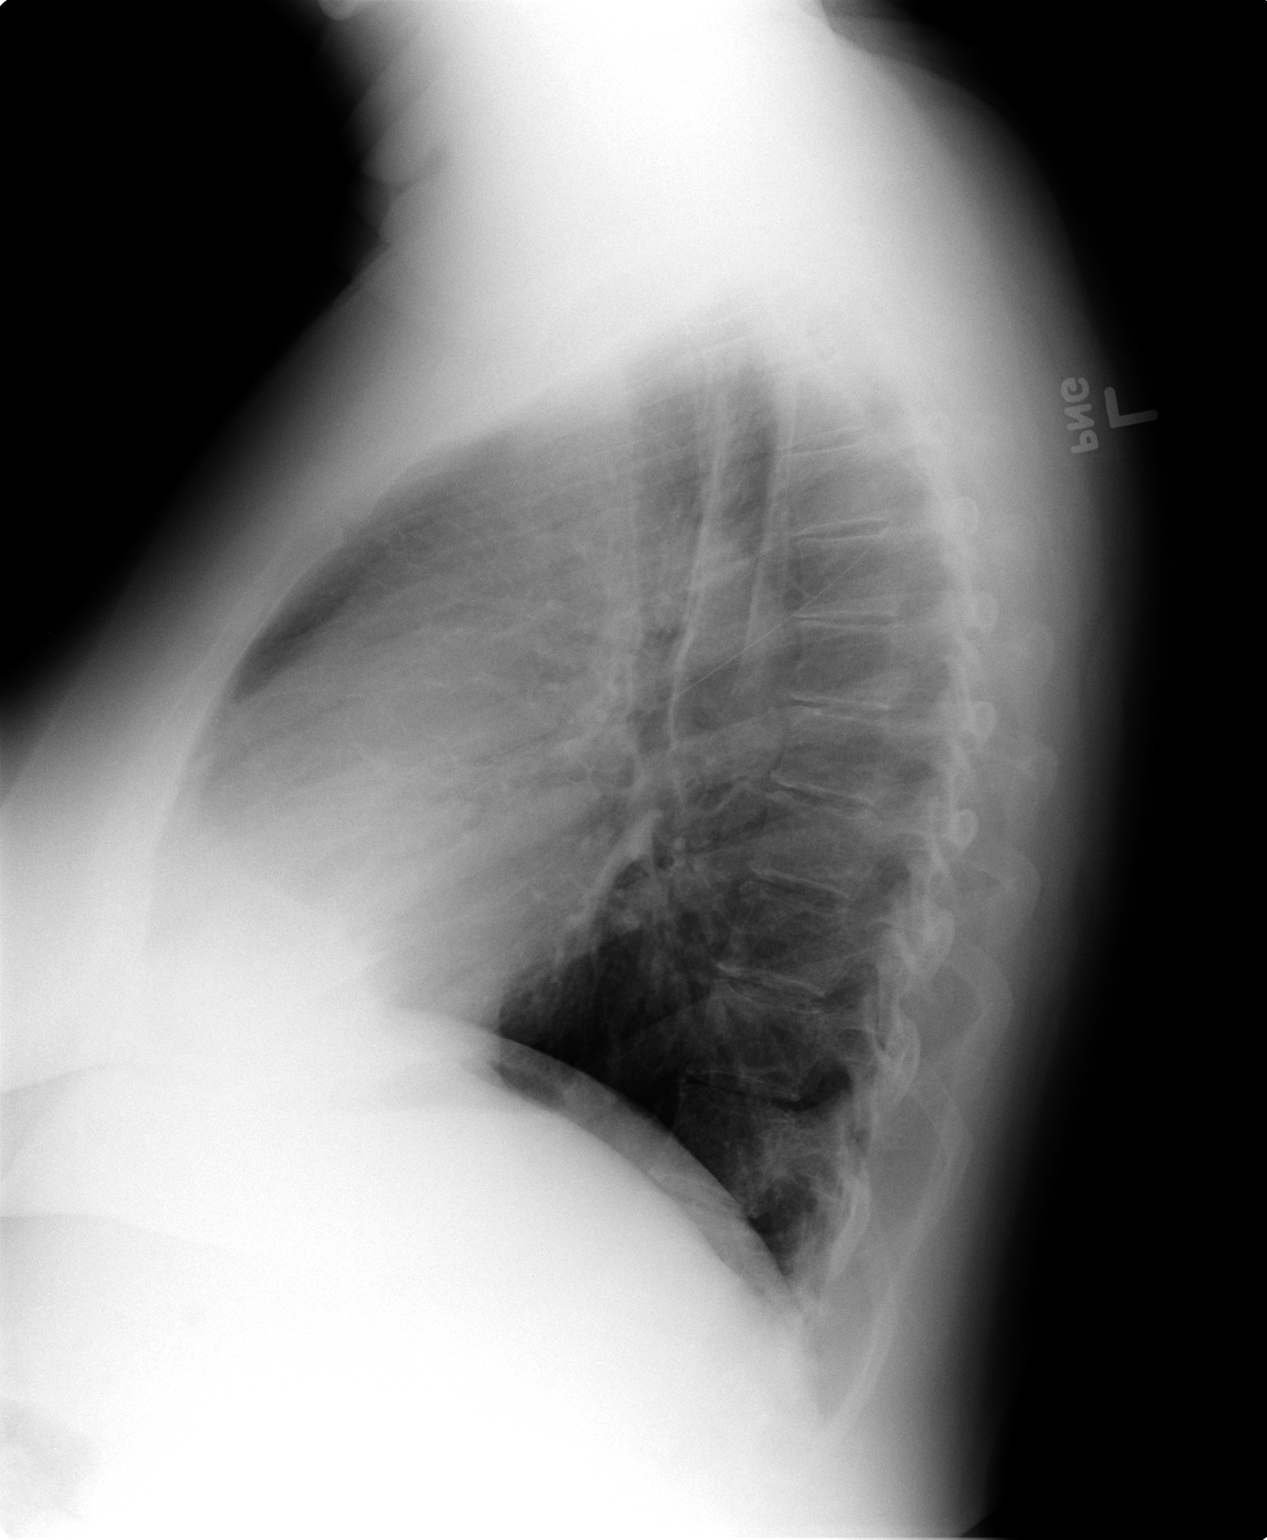

[2 of 2 positions shown; findings below may reference images not displayed]

FINDINGS: Cardiomediastinal silhouette remains normal in size and
configuration. Lungs remain clear. Lung volumes are normal. No
osseous abnormality.
IMPRESSION: Normal chest x-ray. No evidence of acute cardiopulmonary
abnormality.

## 2016-06-18 DIAGNOSIS — I10 Essential (primary) hypertension: Secondary | ICD-10-CM | POA: Diagnosis not present

## 2016-06-18 DIAGNOSIS — E782 Mixed hyperlipidemia: Secondary | ICD-10-CM | POA: Diagnosis not present

## 2016-06-18 DIAGNOSIS — E559 Vitamin D deficiency, unspecified: Secondary | ICD-10-CM | POA: Diagnosis not present

## 2016-06-18 DIAGNOSIS — F320 Major depressive disorder, single episode, mild: Secondary | ICD-10-CM | POA: Diagnosis not present

## 2016-06-18 DIAGNOSIS — R7301 Impaired fasting glucose: Secondary | ICD-10-CM | POA: Diagnosis not present

## 2016-09-26 DIAGNOSIS — E6609 Other obesity due to excess calories: Secondary | ICD-10-CM | POA: Diagnosis not present

## 2016-09-26 DIAGNOSIS — Z6831 Body mass index (BMI) 31.0-31.9, adult: Secondary | ICD-10-CM | POA: Diagnosis not present

## 2016-09-26 DIAGNOSIS — I10 Essential (primary) hypertension: Secondary | ICD-10-CM | POA: Diagnosis not present

## 2016-09-26 DIAGNOSIS — E782 Mixed hyperlipidemia: Secondary | ICD-10-CM | POA: Diagnosis not present

## 2016-11-04 DIAGNOSIS — J208 Acute bronchitis due to other specified organisms: Secondary | ICD-10-CM | POA: Diagnosis not present

## 2016-11-04 DIAGNOSIS — D860 Sarcoidosis of lung: Secondary | ICD-10-CM | POA: Diagnosis not present

## 2016-11-04 DIAGNOSIS — H40053 Ocular hypertension, bilateral: Secondary | ICD-10-CM | POA: Diagnosis not present

## 2016-11-04 DIAGNOSIS — H2512 Age-related nuclear cataract, left eye: Secondary | ICD-10-CM | POA: Diagnosis not present

## 2016-11-04 DIAGNOSIS — R05 Cough: Secondary | ICD-10-CM | POA: Diagnosis not present

## 2017-01-06 DIAGNOSIS — H2512 Age-related nuclear cataract, left eye: Secondary | ICD-10-CM | POA: Diagnosis not present

## 2017-01-06 DIAGNOSIS — H40053 Ocular hypertension, bilateral: Secondary | ICD-10-CM | POA: Diagnosis not present

## 2017-01-08 DIAGNOSIS — E782 Mixed hyperlipidemia: Secondary | ICD-10-CM | POA: Diagnosis not present

## 2017-01-08 DIAGNOSIS — Z6831 Body mass index (BMI) 31.0-31.9, adult: Secondary | ICD-10-CM | POA: Diagnosis not present

## 2017-01-08 DIAGNOSIS — I10 Essential (primary) hypertension: Secondary | ICD-10-CM | POA: Diagnosis not present

## 2017-01-08 DIAGNOSIS — E6609 Other obesity due to excess calories: Secondary | ICD-10-CM | POA: Diagnosis not present

## 2017-03-02 DIAGNOSIS — E668 Other obesity: Secondary | ICD-10-CM | POA: Diagnosis not present

## 2017-03-02 DIAGNOSIS — Z0000 Encounter for general adult medical examination without abnormal findings: Secondary | ICD-10-CM | POA: Diagnosis not present

## 2017-03-02 DIAGNOSIS — Z6833 Body mass index (BMI) 33.0-33.9, adult: Secondary | ICD-10-CM | POA: Diagnosis not present

## 2017-03-02 DIAGNOSIS — Z90711 Acquired absence of uterus with remaining cervical stump: Secondary | ICD-10-CM | POA: Diagnosis not present

## 2017-04-14 DIAGNOSIS — E782 Mixed hyperlipidemia: Secondary | ICD-10-CM | POA: Diagnosis not present

## 2017-04-14 DIAGNOSIS — I10 Essential (primary) hypertension: Secondary | ICD-10-CM | POA: Diagnosis not present

## 2017-04-14 DIAGNOSIS — E6609 Other obesity due to excess calories: Secondary | ICD-10-CM | POA: Diagnosis not present

## 2017-04-14 DIAGNOSIS — Z6833 Body mass index (BMI) 33.0-33.9, adult: Secondary | ICD-10-CM | POA: Diagnosis not present

## 2017-05-07 DIAGNOSIS — H40053 Ocular hypertension, bilateral: Secondary | ICD-10-CM | POA: Diagnosis not present

## 2017-05-22 DIAGNOSIS — Z1231 Encounter for screening mammogram for malignant neoplasm of breast: Secondary | ICD-10-CM | POA: Diagnosis not present

## 2017-07-16 DIAGNOSIS — L918 Other hypertrophic disorders of the skin: Secondary | ICD-10-CM | POA: Diagnosis not present

## 2017-07-16 DIAGNOSIS — I10 Essential (primary) hypertension: Secondary | ICD-10-CM | POA: Diagnosis not present

## 2017-07-16 DIAGNOSIS — E6609 Other obesity due to excess calories: Secondary | ICD-10-CM | POA: Diagnosis not present

## 2017-07-16 DIAGNOSIS — E782 Mixed hyperlipidemia: Secondary | ICD-10-CM | POA: Diagnosis not present

## 2017-07-16 DIAGNOSIS — L299 Pruritus, unspecified: Secondary | ICD-10-CM | POA: Diagnosis not present

## 2017-07-16 DIAGNOSIS — L820 Inflamed seborrheic keratosis: Secondary | ICD-10-CM | POA: Diagnosis not present

## 2017-07-16 DIAGNOSIS — Z6833 Body mass index (BMI) 33.0-33.9, adult: Secondary | ICD-10-CM | POA: Diagnosis not present

## 2017-07-16 DIAGNOSIS — L300 Nummular dermatitis: Secondary | ICD-10-CM | POA: Diagnosis not present

## 2017-07-16 DIAGNOSIS — E559 Vitamin D deficiency, unspecified: Secondary | ICD-10-CM | POA: Diagnosis not present

## 2017-09-02 DIAGNOSIS — H40053 Ocular hypertension, bilateral: Secondary | ICD-10-CM | POA: Diagnosis not present

## 2017-11-03 DIAGNOSIS — R5383 Other fatigue: Secondary | ICD-10-CM | POA: Diagnosis not present

## 2017-11-03 DIAGNOSIS — R233 Spontaneous ecchymoses: Secondary | ICD-10-CM | POA: Diagnosis not present

## 2017-12-31 DIAGNOSIS — H40053 Ocular hypertension, bilateral: Secondary | ICD-10-CM | POA: Diagnosis not present

## 2018-01-14 DIAGNOSIS — Z1159 Encounter for screening for other viral diseases: Secondary | ICD-10-CM | POA: Diagnosis not present

## 2018-01-14 DIAGNOSIS — R7301 Impaired fasting glucose: Secondary | ICD-10-CM | POA: Diagnosis not present

## 2018-01-14 DIAGNOSIS — E782 Mixed hyperlipidemia: Secondary | ICD-10-CM | POA: Diagnosis not present

## 2018-01-14 DIAGNOSIS — F320 Major depressive disorder, single episode, mild: Secondary | ICD-10-CM | POA: Diagnosis not present

## 2018-01-14 DIAGNOSIS — I10 Essential (primary) hypertension: Secondary | ICD-10-CM | POA: Diagnosis not present

## 2018-01-14 DIAGNOSIS — E668 Other obesity: Secondary | ICD-10-CM | POA: Diagnosis not present

## 2018-03-03 DIAGNOSIS — Z6833 Body mass index (BMI) 33.0-33.9, adult: Secondary | ICD-10-CM | POA: Diagnosis not present

## 2018-03-03 DIAGNOSIS — E668 Other obesity: Secondary | ICD-10-CM | POA: Diagnosis not present

## 2018-03-03 DIAGNOSIS — Z0000 Encounter for general adult medical examination without abnormal findings: Secondary | ICD-10-CM | POA: Diagnosis not present

## 2018-04-22 DIAGNOSIS — J018 Other acute sinusitis: Secondary | ICD-10-CM | POA: Diagnosis not present

## 2018-04-30 DIAGNOSIS — H40053 Ocular hypertension, bilateral: Secondary | ICD-10-CM | POA: Diagnosis not present

## 2018-07-15 DIAGNOSIS — L820 Inflamed seborrheic keratosis: Secondary | ICD-10-CM | POA: Diagnosis not present

## 2018-07-20 DIAGNOSIS — F320 Major depressive disorder, single episode, mild: Secondary | ICD-10-CM | POA: Diagnosis not present

## 2018-07-20 DIAGNOSIS — E782 Mixed hyperlipidemia: Secondary | ICD-10-CM | POA: Diagnosis not present

## 2018-07-20 DIAGNOSIS — I10 Essential (primary) hypertension: Secondary | ICD-10-CM | POA: Diagnosis not present

## 2018-07-20 DIAGNOSIS — E668 Other obesity: Secondary | ICD-10-CM | POA: Diagnosis not present
